# Patient Record
Sex: Female | Born: 2000 | Race: White | Hispanic: No | Marital: Single | Smoking: Never smoker
Health system: Southern US, Community
[De-identification: ages and names within clinical notes are randomized; demographics above are authoritative.]

---

## 2021-12-20 ENCOUNTER — Emergency Department (HOSPITAL_COMMUNITY): Payer: BC Managed Care – PPO | Admitting: Anesthesiology

## 2021-12-20 ENCOUNTER — Encounter (HOSPITAL_BASED_OUTPATIENT_CLINIC_OR_DEPARTMENT_OTHER): Payer: Self-pay

## 2021-12-20 ENCOUNTER — Other Ambulatory Visit: Payer: Self-pay

## 2021-12-20 ENCOUNTER — Emergency Department (HOSPITAL_BASED_OUTPATIENT_CLINIC_OR_DEPARTMENT_OTHER): Payer: BC Managed Care – PPO

## 2021-12-20 ENCOUNTER — Encounter (HOSPITAL_COMMUNITY)
Admission: EM | Disposition: A | Discharge status: Home / Self Care | Payer: Self-pay | Source: Home / Self Care | Attending: Emergency Medicine

## 2021-12-20 ENCOUNTER — Ambulatory Visit (HOSPITAL_BASED_OUTPATIENT_CLINIC_OR_DEPARTMENT_OTHER)
Admission: EM | Admit: 2021-12-20 | Discharge: 2021-12-20 | Disposition: A | Discharge status: Home / Self Care | Payer: BC Managed Care – PPO | Attending: Emergency Medicine | Admitting: Emergency Medicine

## 2021-12-20 DIAGNOSIS — F17290 Nicotine dependence, other tobacco product, uncomplicated: Secondary | ICD-10-CM | POA: Insufficient documentation

## 2021-12-20 DIAGNOSIS — K3580 Unspecified acute appendicitis: Secondary | ICD-10-CM | POA: Insufficient documentation

## 2021-12-20 HISTORY — PX: LAPAROSCOPIC APPENDECTOMY: SHX408

## 2021-12-20 LAB — URINALYSIS, ROUTINE W REFLEX MICROSCOPIC
Bilirubin Urine: NEGATIVE
Glucose, UA: NEGATIVE mg/dL
Ketones, ur: >=80 mg/dL — AB
Leukocytes,Ua: NEGATIVE
Nitrite: NEGATIVE
Protein, ur: NEGATIVE mg/dL
Specific Gravity, Urine: >=1.03 (ref 1.005–1.030)
pH: 5 (ref 5.0–8.0)

## 2021-12-20 LAB — URINALYSIS, MICROSCOPIC (REFLEX)

## 2021-12-20 LAB — COMPREHENSIVE METABOLIC PANEL
ALT: 16 U/L (ref 0–44)
AST: 20 U/L (ref 15–41)
Albumin: 3.8 g/dL (ref 3.5–5.0)
Alkaline Phosphatase: 48 U/L (ref 38–126)
Anion gap: 8 (ref 5–15)
BUN: 11 mg/dL (ref 6–20)
CO2: 21 mmol/L — ABNORMAL LOW (ref 22–32)
Calcium: 8.8 mg/dL — ABNORMAL LOW (ref 8.9–10.3)
Chloride: 106 mmol/L (ref 98–111)
Creatinine, Ser: 0.62 mg/dL (ref 0.44–1.00)
GFR, Estimated: >60 mL/min (ref >60)
Glucose, Bld: 86 mg/dL (ref 70–99)
Potassium: 3.3 mmol/L — ABNORMAL LOW (ref 3.5–5.1)
Sodium: 135 mmol/L (ref 135–145)
Total Bilirubin: 0.7 mg/dL (ref 0.3–1.2)
Total Protein: 7.3 g/dL (ref 6.5–8.1)

## 2021-12-20 LAB — CBC
HCT: 38.7 % (ref 36.0–46.0)
Hemoglobin: 12.3 g/dL (ref 12.0–15.0)
MCH: 25.8 pg — ABNORMAL LOW (ref 26.0–34.0)
MCHC: 31.8 g/dL (ref 30.0–36.0)
MCV: 81.3 fL (ref 80.0–100.0)
Platelets: 203 10*3/uL (ref 150–400)
RBC: 4.76 MIL/uL (ref 3.87–5.11)
RDW: 14 % (ref 11.5–15.5)
WBC: 12.8 10*3/uL — ABNORMAL HIGH (ref 4.0–10.5)
nRBC: 0 % (ref 0.0–0.2)

## 2021-12-20 LAB — PREGNANCY, URINE: Preg Test, Ur: NEGATIVE

## 2021-12-20 LAB — LIPASE, BLOOD: Lipase: 23 U/L (ref 11–51)

## 2021-12-20 SURGERY — APPENDECTOMY, LAPAROSCOPIC
Anesthesia: General

## 2021-12-20 SURGERY — APPENDECTOMY, LAPAROSCOPIC
Anesthesia: General | Site: Abdomen

## 2021-12-20 MED ORDER — METRONIDAZOLE 500 MG/100ML IV SOLN
500.0000 mg | Freq: Once | INTRAVENOUS | Status: AC
Start: 2021-12-20 — End: 2021-12-20
  Administered 2021-12-20: 500 mg via INTRAVENOUS
  Filled 2021-12-20: qty 100

## 2021-12-20 MED ORDER — BUPIVACAINE-EPINEPHRINE 0.25% -1:200000 IJ SOLN
INTRAMUSCULAR | Status: DC | PRN
  Administered 2021-12-20: 20 mL

## 2021-12-20 MED ORDER — SCOPOLAMINE 1 MG/3DAYS TD PT72
1.0000 | MEDICATED_PATCH | TRANSDERMAL | Status: DC
  Administered 2021-12-20: 1.5 mg via TRANSDERMAL
  Filled 2021-12-20: qty 1

## 2021-12-20 MED ORDER — LACTATED RINGERS IV SOLN: INTRAVENOUS | Status: DC

## 2021-12-20 MED ORDER — PROPOFOL 10 MG/ML IV BOLUS
INTRAVENOUS | Status: DC | PRN
  Administered 2021-12-20: 120 mg via INTRAVENOUS

## 2021-12-20 MED ORDER — MIDAZOLAM HCL 2 MG/2ML IJ SOLN
INTRAMUSCULAR | Status: AC
  Filled 2021-12-20: qty 2

## 2021-12-20 MED ORDER — SODIUM CHLORIDE 0.9 % IV BOLUS
1000.0000 mL | Freq: Once | INTRAVENOUS | Status: AC
  Administered 2021-12-20: 1000 mL via INTRAVENOUS

## 2021-12-20 MED ORDER — PROPOFOL 10 MG/ML IV BOLUS
INTRAVENOUS | Status: AC
  Filled 2021-12-20: qty 20

## 2021-12-20 MED ORDER — OXYCODONE HCL 5 MG PO TABS: 5.0000 mg | ORAL_TABLET | Freq: Once | ORAL | Status: DC | PRN

## 2021-12-20 MED ORDER — AMISULPRIDE (ANTIEMETIC) 5 MG/2ML IV SOLN: 10.0000 mg | Freq: Once | INTRAVENOUS | Status: DC | PRN

## 2021-12-20 MED ORDER — 0.9 % SODIUM CHLORIDE (POUR BTL) OPTIME
TOPICAL | Status: DC | PRN
  Administered 2021-12-20: 1000 mL

## 2021-12-20 MED ORDER — SODIUM CHLORIDE 0.9 % IV SOLN
2.0000 g | Freq: Once | INTRAVENOUS | Status: AC
  Administered 2021-12-20: 2 g via INTRAVENOUS
  Filled 2021-12-20: qty 20

## 2021-12-20 MED ORDER — OXYCODONE HCL 5 MG/5ML PO SOLN: 5.0000 mg | Freq: Once | ORAL | Status: DC | PRN

## 2021-12-20 MED ORDER — FENTANYL CITRATE (PF) 250 MCG/5ML IJ SOLN
INTRAMUSCULAR | Status: AC
  Filled 2021-12-20: qty 5

## 2021-12-20 MED ORDER — ONDANSETRON HCL 4 MG/2ML IJ SOLN
4.0000 mg | Freq: Once | INTRAMUSCULAR | Status: AC
  Administered 2021-12-20: 4 mg via INTRAVENOUS
  Filled 2021-12-20: qty 2

## 2021-12-20 MED ORDER — ONDANSETRON HCL 4 MG/2ML IJ SOLN
INTRAMUSCULAR | Status: DC | PRN
  Administered 2021-12-20: 4 mg via INTRAVENOUS

## 2021-12-20 MED ORDER — FENTANYL CITRATE PF 50 MCG/ML IJ SOSY
25.0000 ug | PREFILLED_SYRINGE | Freq: Once | INTRAMUSCULAR | Status: AC
  Administered 2021-12-20: 25 ug via INTRAVENOUS
  Filled 2021-12-20: qty 1

## 2021-12-20 MED ORDER — DEXAMETHASONE SODIUM PHOSPHATE 10 MG/ML IJ SOLN
INTRAMUSCULAR | Status: DC | PRN
  Administered 2021-12-20: 10 mg via INTRAVENOUS

## 2021-12-20 MED ORDER — LIDOCAINE 2% (20 MG/ML) 5 ML SYRINGE
INTRAMUSCULAR | Status: DC | PRN
  Administered 2021-12-20: 60 mg via INTRAVENOUS

## 2021-12-20 MED ORDER — ORAL CARE MOUTH RINSE: 15.0000 mL | Freq: Once | OROMUCOSAL | Status: AC

## 2021-12-20 MED ORDER — CHLORHEXIDINE GLUCONATE 0.12 % MT SOLN
15.0000 mL | Freq: Once | OROMUCOSAL | Status: AC
  Administered 2021-12-20: 15 mL via OROMUCOSAL

## 2021-12-20 MED ORDER — ROCURONIUM BROMIDE 10 MG/ML (PF) SYRINGE
PREFILLED_SYRINGE | INTRAVENOUS | Status: DC | PRN
  Administered 2021-12-20: 50 mg via INTRAVENOUS

## 2021-12-20 MED ORDER — TRAMADOL HCL 50 MG PO TABS: 50.0000 mg | ORAL_TABLET | Freq: Four times a day (QID) | ORAL | 0 refills | Status: AC | PRN

## 2021-12-20 MED ORDER — SUGAMMADEX SODIUM 200 MG/2ML IV SOLN
INTRAVENOUS | Status: DC | PRN
  Administered 2021-12-20: 200 mg via INTRAVENOUS

## 2021-12-20 MED ORDER — MORPHINE SULFATE (PF) 4 MG/ML IV SOLN
4.0000 mg | Freq: Once | INTRAVENOUS | Status: AC
  Administered 2021-12-20: 4 mg via INTRAVENOUS
  Filled 2021-12-20: qty 1

## 2021-12-20 MED ORDER — MIDAZOLAM HCL 5 MG/5ML IJ SOLN
INTRAMUSCULAR | Status: DC | PRN
  Administered 2021-12-20: 2 mg via INTRAVENOUS

## 2021-12-20 MED ORDER — LIDOCAINE HCL (PF) 2 % IJ SOLN
INTRAMUSCULAR | Status: AC
Start: 2021-12-20 — End: ?
  Filled 2021-12-20: qty 5

## 2021-12-20 MED ORDER — DEXAMETHASONE SODIUM PHOSPHATE 10 MG/ML IJ SOLN
INTRAMUSCULAR | Status: AC
  Filled 2021-12-20: qty 1

## 2021-12-20 MED ORDER — ONDANSETRON HCL 4 MG/2ML IJ SOLN: 4.0000 mg | Freq: Once | INTRAMUSCULAR | Status: DC | PRN

## 2021-12-20 MED ORDER — BUPIVACAINE-EPINEPHRINE (PF) 0.25% -1:200000 IJ SOLN
INTRAMUSCULAR | Status: AC
Start: 2021-12-20 — End: ?
  Filled 2021-12-20: qty 30

## 2021-12-20 MED ORDER — IOHEXOL 300 MG/ML  SOLN
100.0000 mL | Freq: Once | INTRAMUSCULAR | Status: AC | PRN
Start: 2021-12-20 — End: 2021-12-20
  Administered 2021-12-20: 100 mL via INTRAVENOUS

## 2021-12-20 MED ORDER — DEXMEDETOMIDINE (PRECEDEX) IN NS 20 MCG/5ML (4 MCG/ML) IV SYRINGE
PREFILLED_SYRINGE | INTRAVENOUS | Status: AC
  Filled 2021-12-20: qty 5

## 2021-12-20 MED ORDER — FENTANYL CITRATE (PF) 100 MCG/2ML IJ SOLN
INTRAMUSCULAR | Status: DC | PRN
Start: 2021-12-20 — End: 2021-12-20
  Administered 2021-12-20: 100 ug via INTRAVENOUS
  Administered 2021-12-20: 50 ug via INTRAVENOUS
  Administered 2021-12-20: 100 ug via INTRAVENOUS

## 2021-12-20 MED ORDER — ACETAMINOPHEN 500 MG PO TABS
1000.0000 mg | ORAL_TABLET | Freq: Once | ORAL | Status: AC
  Administered 2021-12-20: 1000 mg via ORAL
  Filled 2021-12-20: qty 2

## 2021-12-20 MED ORDER — FENTANYL CITRATE PF 50 MCG/ML IJ SOSY: 25.0000 ug | PREFILLED_SYRINGE | INTRAMUSCULAR | Status: DC | PRN

## 2021-12-20 MED ORDER — ROCURONIUM BROMIDE 10 MG/ML (PF) SYRINGE
PREFILLED_SYRINGE | INTRAVENOUS | Status: AC
  Filled 2021-12-20: qty 10

## 2021-12-20 MED ORDER — ONDANSETRON HCL 4 MG/2ML IJ SOLN
INTRAMUSCULAR | Status: AC
  Filled 2021-12-20: qty 2

## 2021-12-20 MED ORDER — LACTATED RINGERS IV SOLN
INTRAVENOUS | Status: DC | PRN
  Administered 2021-12-20: 1000 mL

## 2021-12-20 SURGICAL SUPPLY — 45 items
APPLIER CLIP ROT 10 11.4 M/L (STAPLE)
BAG COUNTER SPONGE SURGICOUNT (BAG) IMPLANT
BAG RETRIEVAL 10 (BASKET) ×1
CHLORAPREP W/TINT 26 (MISCELLANEOUS) ×2 IMPLANT
CLIP APPLIE ROT 10 11.4 M/L (STAPLE) IMPLANT
COVER SURGICAL LIGHT HANDLE (MISCELLANEOUS) ×2 IMPLANT
CUTTER FLEX LINEAR 45M (STAPLE) ×1 IMPLANT
DERMABOND ADVANCED (GAUZE/BANDAGES/DRESSINGS) ×1
DERMABOND ADVANCED .7 DNX12 (GAUZE/BANDAGES/DRESSINGS) ×1 IMPLANT
DRAPE LAPAROSCOPIC ABDOMINAL (DRAPES) ×2 IMPLANT
ELECT PENCIL ROCKER SW 15FT (MISCELLANEOUS) IMPLANT
ELECT REM PT RETURN 15FT ADLT (MISCELLANEOUS) ×2 IMPLANT
ENDOLOOP SUT PDS II  0 18 (SUTURE)
ENDOLOOP SUT PDS II 0 18 (SUTURE) IMPLANT
GLOVE SURG ORTHO 8.0 STRL STRW (GLOVE) ×2 IMPLANT
GLOVE SURG SYN 7.5  E (GLOVE) ×1
GLOVE SURG SYN 7.5 E (GLOVE) ×1 IMPLANT
GLOVE SURG SYN 7.5 PF PI (GLOVE) ×1 IMPLANT
GOWN STRL REUS W/ TWL XL LVL3 (GOWN DISPOSABLE) ×2 IMPLANT
GOWN STRL REUS W/TWL XL LVL3 (GOWN DISPOSABLE) ×2
IRRIG SUCT STRYKERFLOW 2 WTIP (MISCELLANEOUS) ×2
IRRIGATION SUCT STRKRFLW 2 WTP (MISCELLANEOUS) ×1 IMPLANT
KIT BASIN OR (CUSTOM PROCEDURE TRAY) ×2 IMPLANT
KIT TURNOVER KIT A (KITS) IMPLANT
RELOAD 45 VASCULAR/THIN (ENDOMECHANICALS) IMPLANT
RELOAD STAPLE 45 2.5 WHT GRN (ENDOMECHANICALS) IMPLANT
RELOAD STAPLE 45 3.5 BLU ETS (ENDOMECHANICALS) IMPLANT
RELOAD STAPLE TA45 3.5 REG BLU (ENDOMECHANICALS) ×2 IMPLANT
SET TUBE SMOKE EVAC HIGH FLOW (TUBING) ×2 IMPLANT
SHEARS HARMONIC ACE PLUS 36CM (ENDOMECHANICALS) ×2 IMPLANT
SPIKE FLUID TRANSFER (MISCELLANEOUS) ×2 IMPLANT
STRIP CLOSURE SKIN 1/2X4 (GAUZE/BANDAGES/DRESSINGS) ×2 IMPLANT
SUT MNCRL AB 4-0 PS2 18 (SUTURE) ×2 IMPLANT
SYS BAG RETRIEVAL 10MM (BASKET) ×1
SYSTEM BAG RETRIEVAL 10MM (BASKET) ×1 IMPLANT
TOWEL OR 17X26 10 PK STRL BLUE (TOWEL DISPOSABLE) ×2 IMPLANT
TOWEL OR NON WOVEN STRL DISP B (DISPOSABLE) ×2 IMPLANT
TRAY FOL W/BAG SLVR 16FR STRL (SET/KITS/TRAYS/PACK) IMPLANT
TRAY FOLEY MTR SLVR 14FR STAT (SET/KITS/TRAYS/PACK) IMPLANT
TRAY FOLEY MTR SLVR 16FR STAT (SET/KITS/TRAYS/PACK) IMPLANT
TRAY FOLEY W/BAG SLVR 16FR LF (SET/KITS/TRAYS/PACK) ×1
TRAY LAPAROSCOPIC (CUSTOM PROCEDURE TRAY) ×2 IMPLANT
TROCAR BALLN 12MMX100 BLUNT (TROCAR) ×2 IMPLANT
TROCAR XCEL NON-BLD 11X100MML (ENDOMECHANICALS) ×2 IMPLANT
TROCAR Z-THREAD OPTICAL 5X100M (TROCAR) ×2 IMPLANT

## 2021-12-20 NOTE — ED Notes (Signed)
Phone Handoff Report provided to Short Stay/ PreOp RN at [facility]

## 2021-12-20 NOTE — ED Notes (Signed)
NPO SINCE 0900HRS TODAY ACTUAL WT 76.9KG

## 2021-12-20 NOTE — H&P (Signed)
Danielle Manning 14-Sep-2000  [id number].    Requesting MD: Dalene Seltzer, MD Chief Complaint/Reason for Consult: acute appendicitis   HPI:  Danielle Manning is a 21 y/o F with no known PMH who presented to [facility] with a cc of abdominal pain. States right lower quadrant abdominal pain woke her up from her sleep. Pain described as severe and non-radiating. Tried OTC gas medication without relief of sxs. Associated with nausea. Denies fever, chills, vomiting, or diarrhea. She initially went to urgent care. UA and pregnancy test were negative and she was advised to go to ED for a CT scan. CT abdomen was concerning for early acute appendicitis and surgery was asked to evaluate.   Pt denies a history of abd surgery. Denies tobacco use. Denies use of blood thinning medications. NKDA. Does have a latex allergy.   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 She is living with her parents for the summer.   ROS: As above Review of Systems  All other systems reviewed and are negative.  No family history on file.  History reviewed. No pertinent past medical history.   Social History:  reports that she has never smoked. She has never used smokeless tobacco. She reports current alcohol use. She reports that she does not use drugs.  Allergies:  Allergies  Allergen Reactions   Latex Rash    No medications prior to admission.     Physical Exam: Blood pressure 120/89, pulse 74, temperature 98 F (36.7 C), temperature source Oral, resp. rate 16, height 5\' 8"  (1.727 m), weight 73.5 kg, last menstrual period 11/21/2021, SpO2 100 %. General: Pleasant female laying on hospital bed, appears stated age, NAD. HEENT: head -normocephalic, atraumatic; Eyes: PERRLA, no conjunctival injection Neck- Trachea is midline, no thyromegaly appreciated.  CV- RRR, normal S1/S2, no M/R/G, no peripheral edema  Pulm- breathing is non-labored on room  air Abd- soft, generalized ttp with increased ttp in RLQ, no peritonitis, no organomegaly, no masses/hernias  GU- deferred  MSK- UE/LE symmetrical, no cyanosis, clubbing, or edema. Neuro- CN II-XII grossly in tact, gait not assessed. Psych- Alert and Oriented x3 with appropriate affect Skin: warm and dry, no rashes or lesions   Results for orders placed or performed during the hospital encounter of 12/20/21 (from the past 48 hour(s))  Lipase, blood     Status: None   Collection Time: 12/20/21 10:37 AM  Result Value Ref Range   Lipase 23 11 - 51 U/L    Comment: Performed at [facility], [address] [place], [place]  Comprehensive metabolic panel     Status: Abnormal   Collection Time: 12/20/21 10:37 AM  Result Value Ref Range   Sodium 135 135 - 145 mmol/L   Potassium 3.3 (L) 3.5 - 5.1 mmol/L   Chloride 106 98 - 111 mmol/L   CO2 21 (L) 22 - 32 mmol/L   Glucose, Bld 86 70 - 99 mg/dL    Comment: Glucose reference range applies only to samples taken after fasting for at least 8 hours.   BUN 11 6 - 20 mg/dL   Creatinine, Ser 02/19/22 0.44 - 1.00 mg/dL   Calcium 8.8 (L) 8.9 - 10.3 mg/dL   Total Protein 7.3 6.5 - 8.1 g/dL   Albumin 3.8 3.5 - 5.0 g/dL   AST 20 15 - 41 U/L   ALT 16 0 - 44 U/L   Alkaline Phosphatase 48  38 - 126 U/L   Total Bilirubin 0.7 0.3 - 1.2 mg/dL   GFR, Estimated >16>60 >10>60 mL/min    Comment: (NOTE) Calculated using the CKD-EPI Creatinine Equation (2021)    Anion gap 8 5 - 15    Comment: Performed at [facility], [address] [place], [place] [address]  CBC     Status: Abnormal   Collection Time: 12/20/21 10:37 AM  Result Value Ref Range   WBC 12.8 (H) 4.0 - 10.5 K/uL   RBC 4.76 3.87 - 5.11 MIL/uL   Hemoglobin 12.3 12.0 - 15.0 g/dL   HCT 40.938.7 81.136.0 - 91.446.0 %   MCV 81.3 80.0 - 100.0 fL   MCH 25.8 (L) 26.0 - 34.0 pg   MCHC 31.8 30.0 - 36.0 g/dL   RDW 78.214.0 95.611.5 - 21.315.5 %   Platelets 203 150 - 400 K/uL   nRBC 0.0 0.0 - 0.2 %     Comment: Performed at [facility] [place], [address] [place], [place] [address]  Urinalysis, Routine w reflex microscopic Urine, Clean Catch     Status: Abnormal   Collection Time: 12/20/21 11:05 AM  Result Value Ref Range   Color, Urine YELLOW YELLOW   APPearance CLEAR CLEAR   Specific Gravity, Urine >=1.030 1.005 - 1.030   pH 5.0 5.0 - 8.0   Glucose, UA NEGATIVE NEGATIVE mg/dL   Hgb urine dipstick TRACE (A) NEGATIVE   Bilirubin Urine NEGATIVE NEGATIVE   Ketones, ur >=80 (A) NEGATIVE mg/dL   Protein, ur NEGATIVE NEGATIVE mg/dL   Nitrite NEGATIVE NEGATIVE   Leukocytes,Ua NEGATIVE NEGATIVE    Comment: Performed at [facility] [place], [address] [place], [place] [address]  Pregnancy, urine     Status: None   Collection Time: 12/20/21 11:05 AM  Result Value Ref Range   Preg Test, Ur NEGATIVE NEGATIVE    Comment:        THE SENSITIVITY OF THIS METHODOLOGY IS >20 mIU/mL. Performed at [facility] [place], [address] [place], [place] [address]   Urinalysis, Microscopic (reflex)     Status: Abnormal   Collection Time: 12/20/21 11:05 AM  Result Value Ref Range   RBC / HPF 0-5 0 - 5 RBC/hpf   WBC, UA 0-5 0 - 5 WBC/hpf   Bacteria, UA MANY (A) NONE SEEN   Squamous Epithelial / LPF 6-10 0 - 5   Mucus PRESENT     Comment: Performed at [facility] [place], [address] [place], [place] [address]   CT ABDOMEN PELVIS W CONTRAST  Result Date: 12/20/2021 CLINICAL DATA:  Right lower quadrant pain which awoke patient earlier this morning. EXAM: CT ABDOMEN AND PELVIS WITH CONTRAST TECHNIQUE: Multidetector CT imaging of the abdomen and pelvis was performed using the standard protocol following bolus administration of intravenous contrast. RADIATION DOSE REDUCTION: This exam was performed according to the departmental dose-optimization program which includes automated exposure control, adjustment of the mA and/or kV according to patient size and/or use of iterative  reconstruction technique. CONTRAST:  100mL OMNIPAQUE IOHEXOL 300 MG/ML  SOLN COMPARISON:  None Available. FINDINGS: Lower chest: Clear lung bases. Normal heart size without pericardial or pleural effusion. Hepatobiliary: Mild focal steatosis in segment 4 B. Normal gallbladder, without biliary ductal dilatation. Pancreas: Normal, without mass or ductal dilatation. Spleen: Normal in size, without focal abnormality. Adrenals/Urinary Tract: Normal adrenal glands. Normal kidneys, without hydronephrosis. Normal urinary bladder. Stomach/Bowel: Normal stomach, without  wall thickening. Normal colon and terminal ileum. The appendix is best evaluated on coronal reformats. Originates on image 56 and extends caudally to terminate on image 51. Measures maximally 9 mm on image 52. The appendiceal tip is mildly inflamed with mucosal hyperenhancement, including on 53/2. Normal small bowel. Vascular/Lymphatic: Normal caliber of the aorta and branch vessels. Mildly prominent ileocolic mesenteric nodes are within normal variation for age. No pelvic sidewall adenopathy. Reproductive: Normal uterus and adnexa. Left perineal/labial cystic lesion of 2.2 x 1.9 cm on 93/2. Other: Trace free pelvic fluid is likely physiologic. No free intraperitoneal air. Musculoskeletal: Mild disc bulge at the lumbosacral junction. IMPRESSION: 1. Mild/early non complicated appendicitis, primarily centered about the tip. 2. Left labial/perineal cystic lesion is most likely a Bartholin's gland cyst. This could be correlated with physical exam. These results were called by telephone at the time of interpretation on 12/20/2021 at 12:44 pm to provider [facility] , who verbally acknowledged these results. Electronically Signed   By: Jeronimo Greaves M.D.   On: 12/20/2021 12:47      Assessment/Plan Acute appendicitis without evidence of perforation This is a 21 y/o F with <24h of acute onset abdominal pain, leukocytosis of 12,800, and CT of the abdomen and pelvis  consistent with uncomplicated acute appendicitis. Recommend laparoscopic appendectomy today. NPO. IVF. Possible discharge from PACU post-operatively pending intraoperative findings. If needed will admit to observation post-operatively.    I reviewed nursing notes, ED provider notes, last 24 h vitals and pain scores, last 48 h intake and output, last 24 h labs and trends, and last 24 h imaging results.  Juliet Rude, [facility] Surgery 12/20/2021, 2:51 PM Please see Amion for pager number during day hours 7:00am-4:30pm or 7:00am -11:30am on weekends

## 2021-12-20 NOTE — ED Triage Notes (Signed)
C/o abdominal pain, worse on RLQ. Seen at [facility], sent here for eval for appendicitis.

## 2021-12-20 NOTE — ED Notes (Signed)
All Jewelry removed, non-slip socks placed on client. Mobile phone given to mother.

## 2021-12-20 NOTE — ED Notes (Signed)
ED PA aware of pt cont c/o pain, orders rec

## 2021-12-20 NOTE — ED Provider Notes (Signed)
[facility] EMERGENCY DEPARTMENT Provider Note   CSN: [id number] Arrival date & time: 12/20/21  1004     History  Chief Complaint  Patient presents with   Abdominal Pain    Danielle Manning is a 21 y.o. female presenting to the ED with a chief complaint of right lower quadrant abdominal pain.  Symptoms began approximately 4-1/2 hours ago when she woke up from her sleep.  Reports associated nausea.  Pain will sometimes radiate throughout her entire abdomen.  She has tried Gas-X and Pepto-Bismol without much improvement in her symptoms.  Denies any changes to bowel movements, urination, vaginal or pelvic complaints.  No prior abdominal surgeries.  Seen at urgent care with negative urinalysis and pregnancy test and was sent to the ER to rule out appendicitis.  Last night was in her usual state of health and asymptomatic.  No chest pain, shortness of breath.   Abdominal Pain Associated symptoms: nausea   Associated symptoms: no chest pain, no chills, no constipation, no cough, no diarrhea, no dysuria, no fever, no hematuria, no shortness of breath, no sore throat and no vomiting       Home Medications Prior to Admission medications   Not on File      Allergies    Latex    Review of Systems   Review of Systems  Constitutional:  Negative for appetite change, chills and fever.  HENT:  Negative for ear pain, rhinorrhea, sneezing and sore throat.   Eyes:  Negative for photophobia and visual disturbance.  Respiratory:  Negative for cough, chest tightness, shortness of breath and wheezing.   Cardiovascular:  Negative for chest pain and palpitations.  Gastrointestinal:  Positive for abdominal pain and nausea. Negative for blood in stool, constipation, diarrhea and vomiting.  Genitourinary:  Negative for dysuria, hematuria and urgency.  Musculoskeletal:  Negative for myalgias.  Skin:  Negative for rash.  Neurological:  Negative for dizziness, weakness and light-headedness.    Physical Exam Updated Vital Signs BP 120/89 (BP Location: Right Arm)   Pulse 74   Temp 98 F (36.7 C) (Oral)   Resp 16   Ht 5\' 8"  (1.727 m)   Wt 73.5 kg   LMP 11/21/2021 (Approximate) Comment: neg upreg in er today.  SpO2 100%   BMI 24.63 kg/m  Physical Exam Vitals and nursing note reviewed.  Constitutional:      General: She is not in acute distress.    Appearance: She is well-developed.  HENT:     Head: Normocephalic and atraumatic.     Nose: Nose normal.  Eyes:     General: No scleral icterus.       Left eye: No discharge.     Conjunctiva/sclera: Conjunctivae normal.  Cardiovascular:     Rate and Rhythm: Normal rate and regular rhythm.     Heart sounds: Normal heart sounds. No murmur heard.   No friction rub. No gallop.  Pulmonary:     Effort: Pulmonary effort is normal. No respiratory distress.     Breath sounds: Normal breath sounds.  Abdominal:     General: Bowel sounds are normal. There is no distension.     Palpations: Abdomen is soft.     Tenderness: There is abdominal tenderness in the right lower quadrant. There is no guarding.  Musculoskeletal:        General: Normal range of motion.     Cervical back: Normal range of motion and neck supple.  Skin:    General: Skin  is warm and dry.     Findings: No rash.  Neurological:     Mental Status: She is alert.     Motor: No abnormal muscle tone.     Coordination: Coordination normal.    ED Results / Procedures / Treatments   Labs (all labs ordered are listed, but only abnormal results are displayed) Labs Reviewed  COMPREHENSIVE METABOLIC PANEL - Abnormal; Notable for the following components:      Result Value   Potassium 3.3 (*)    CO2 21 (*)    Calcium 8.8 (*)    All other components within normal limits  CBC - Abnormal; Notable for the following components:   WBC 12.8 (*)    MCH 25.8 (*)    All other components within normal limits  URINALYSIS, ROUTINE W REFLEX MICROSCOPIC - Abnormal; Notable  for the following components:   Hgb urine dipstick TRACE (*)    Ketones, ur >=80 (*)    All other components within normal limits  URINALYSIS, MICROSCOPIC (REFLEX) - Abnormal; Notable for the following components:   Bacteria, UA MANY (*)    All other components within normal limits  LIPASE, BLOOD  PREGNANCY, URINE    EKG None  Radiology CT ABDOMEN PELVIS W CONTRAST  Result Date: 12/20/2021 CLINICAL DATA:  Right lower quadrant pain which awoke patient earlier this morning. EXAM: CT ABDOMEN AND PELVIS WITH CONTRAST TECHNIQUE: Multidetector CT imaging of the abdomen and pelvis was performed using the standard protocol following bolus administration of intravenous contrast. RADIATION DOSE REDUCTION: This exam was performed according to the departmental dose-optimization program which includes automated exposure control, adjustment of the mA and/or kV according to patient size and/or use of iterative reconstruction technique. CONTRAST:  OMNIPAQUE IOHEXOL 300 MG/ML  SOLN COMPARISON:  None Available. FINDINGS: Lower chest: Clear lung bases. Normal heart size without pericardial or pleural effusion. Hepatobiliary: Mild focal steatosis in segment 4 B. Normal gallbladder, without biliary ductal dilatation. Pancreas: Normal, without mass or ductal dilatation. Spleen: Normal in size, without focal abnormality. Adrenals/Urinary Tract: Normal adrenal glands. Normal kidneys, without hydronephrosis. Normal urinary bladder. Stomach/Bowel: Normal stomach, without wall thickening. Normal colon and terminal ileum. The appendix is best evaluated on coronal reformats. Originates on image 56 and extends caudally to terminate on image 51. Measures maximally 9 mm on image 52. The appendiceal tip is mildly inflamed with mucosal hyperenhancement, including on 53/2. Normal small bowel. Vascular/Lymphatic: Normal caliber of the aorta and branch vessels. Mildly prominent ileocolic mesenteric nodes are within normal  variation for age. No pelvic sidewall adenopathy. Reproductive: Normal uterus and adnexa. Left perineal/labial cystic lesion of 2.2 x 1.9 cm on 93/2. Other: Trace free pelvic fluid is likely physiologic. No free intraperitoneal air. Musculoskeletal: Mild disc bulge at the lumbosacral junction. IMPRESSION: 1. Mild/early non complicated appendicitis, primarily centered about the tip. 2. Left labial/perineal cystic lesion is most likely a Bartholin's gland cyst. This could be correlated with physical exam. These results were called by telephone at the time of interpretation on 12/20/2021 at 12:44 pm to provider [facility] , who verbally acknowledged these results. Electronically Signed   By: Jeronimo Greaves M.D.   On: 12/20/2021 12:47    Procedures Procedures    Medications Ordered in ED Medications  cefTRIAXone (ROCEPHIN) 2 g in sodium chloride 0.9 % 100 mL IVPB (2 g Intravenous New Bag/Given 12/20/21 1401)    And  metroNIDAZOLE (FLAGYL) IVPB 500 mg (0 mg Intravenous Stopped 12/20/21 1353)  sodium chloride 0.9 %  bolus 1,000 mL (0 mLs Intravenous Stopped 12/20/21 1145)  ondansetron (ZOFRAN) injection 4 mg (4 mg Intravenous Given 12/20/21 1040)  morphine (PF) 4 MG/ML injection 4 mg (4 mg Intravenous Given 12/20/21 1041)  fentaNYL (SUBLIMAZE) injection 25 mcg (25 mcg Intravenous Given 12/20/21 1145)  iohexol (OMNIPAQUE) 300 MG/ML solution 100 mL (100 mLs Intravenous Contrast Given 12/20/21 1225)  fentaNYL (SUBLIMAZE) injection 25 mcg (25 mcg Intravenous Given 12/20/21 1339)    ED Course/ Medical Decision Making/ A&P Clinical Course as of 12/20/21 1405  Tue Dec 20, 2021  1051 WBC(!): 12.8 [HK]    Clinical Course User Index [HK] Dietrich Pates, PA-C                           Medical Decision Making Amount and/or Complexity of Data Reviewed Labs: ordered. Decision-making details documented in ED Course. Radiology: ordered.  Risk Prescription drug management.   21 year old female presenting to the ED for right  lower quadrant abdominal pain.  Symptoms began when she woke up today.  Reports associated nausea.  No changes to bowel movements, urination, pelvic or vaginal complaints.  No chest pain or shortness of breath.  On exam there is tenderness palpation of the right lower quadrant without rebound or guarding.  Vital signs within normal limits.  She had a negative pregnancy test at urgent care just prior to arrival and was sent to the ER to rule out appendicitis.  Will obtain labs, imaging and reassess after results and medication for improvement.  Work-up significant for leukocytosis of 12.3 per urinalysis with many bacteria but patient asymptomatic.  Ketonuria noted as well.  Given IV fluids.  Pregnancy test is negative.  CT of abdomen pelvis shows findings consistent with early noncomplicated appendicitis.  She also has concern for possible left labial Bartholin gland cyst.  Patient states that she is not experiencing any pain or swelling in this area.  I have ordered antibiotics for appendicitis and will consult general surgery.  General surgery to admit patient to their service. Patient has been NPO since waking up this morning.   Portions of this note were generated with Scientist, clinical (histocompatibility and immunogenetics). Dictation errors may occur despite best attempts at proofreading.        Final Clinical Impression(s) / ED Diagnoses Final diagnoses:  Acute appendicitis, unspecified acute appendicitis type    Rx / DC Orders ED Discharge Orders     None         Dietrich Pates, PA-C 12/20/21 1405    Alvira Monday, MD 12/20/21 2214

## 2021-12-20 NOTE — Anesthesia Procedure Notes (Signed)
Procedure Name: Intubation Date/Time: 12/20/2021 3:22 PM Performed by: Gerald Leitz, CRNA Pre-anesthesia Checklist: Patient identified, Patient being monitored, Timeout performed, Emergency Drugs available and Suction available Patient Re-evaluated:Patient Re-evaluated prior to induction Oxygen Delivery Method: Circle system utilized Preoxygenation: Pre-oxygenation with 100% oxygen Induction Type: IV induction Ventilation: Mask ventilation without difficulty Laryngoscope Size: Mac and 3 Grade View: Grade I Tube type: Oral Tube size: 7.0 mm Number of attempts: 1 Airway Equipment and Method: Stylet Placement Confirmation: ETT inserted through vocal cords under direct vision, positive ETCO2 and breath sounds checked- equal and bilateral Secured at: 21 cm Tube secured with: Tape Dental Injury: Teeth and Oropharynx as per pre-operative assessment

## 2021-12-20 NOTE — ED Notes (Signed)
Pt instructed not to get up off stretcher without a staff member in room with her, also instructed to remain NPO until further notice

## 2021-12-20 NOTE — Op Note (Signed)
OPERATIVE REPORT - LAPAROSCOPIC APPENDECTOMY  Preop diagnosis:  Acute appendicitis  Postop diagnosis:  same  Procedure:  Laparoscopic appendectomy  Surgeon:  Darnell Level, MD  Anesthesia:  general endotracheal  Estimated blood loss:  minimal  Preparation:  Chlora-prep  Complications:  none  Indications:  Patient is a 21 yo female who presents to [facility] with <24 hour history of abdominal pain localized to the RLQ.  CT confirms acute appendicitis.  Patient now comes to surgery for appendectomy.  Procedure:  Patient was brought to the operating room and placed in a supine position on the operating room table. Following administration of general anesthesia, a time out was held and the patient's name and procedure was confirmed. Patient was then prepped and draped in the usual strict aseptic fashion.  After ascertaining that an adequate level of anesthesia had been achieved, a peri-umbilical incision was made with a #15 blade. Dissection was carried down to the fascia. Fascia was incised in the midline and the peritoneal cavity was entered cautiously. A #0-vicryl pursestring suture was placed in the fascia. An Hassan cannula was introduced under direct vision and secured with the pursestring suture. The abdomen was insufflated with carbon dioxide. The laparoscope was introduced and the abdomen was explored. Operative ports were placed in the right upper quadrant and left lower quadrant.  The cecum was grasped and rotated cephalad and medially.  The appendix had a normal base and extended laterally and superiorly along the right colic gutter.  The middle third and distal third of the appendix were dilated and indurated consistent with acute appendicitis.  There was no sign of perforation.  A window was created with the harmonic scalpel at the base of the appendix.  An Endo GIA stapler was used to transect the base of the appendix at the junction with the cecal wall.  Staple line was intact  with good hemostasis.  Mesoappendix was taken down with the harmonic scalpel.  The appendix was completely resected and placed into an Endo Catch bag and withdrawn through the umbilical port.  It is submitted to pathology for review.  The #0-vicryl pursestring suture was tied securely.  Right lower quadrant was irrigated with warm saline which was evacuated. Good hemostasis was noted. Ports were removed under direct vision. Good hemostasis was noted at the port sites. Pneumoperitoneum was released.  Skin incisions were anesthetized with local anesthetic. Wounds were closed with interrupted 4-0 Monocryl subcuticular sutures. Wounds were washed and dried and Dermabond was applied. The patient was awakened from anesthesia and brought to the recovery room. The patient tolerated the procedure well.  Darnell Level, MD [facility] Surgery Office: [phone]

## 2021-12-20 NOTE — Anesthesia Preprocedure Evaluation (Signed)
Anesthesia Evaluation  Patient identified by MRN, date of birth, ID band Patient awake    Reviewed: Allergy & Precautions, NPO status , Patient's Chart, lab work & pertinent test results  Airway Mallampati: I  TM Distance: >3 FB Neck ROM: Full    Dental no notable dental hx.    Pulmonary Current Smoker (vapes),    Pulmonary exam normal breath sounds clear to auscultation       Cardiovascular negative cardio ROS Normal cardiovascular exam Rhythm:Regular Rate:Normal     Neuro/Psych negative neurological ROS  negative psych ROS   GI/Hepatic Neg liver ROS, Acute appendicitis   Endo/Other  negative endocrine ROS  Renal/GU negative Renal ROS  negative genitourinary   Musculoskeletal negative musculoskeletal ROS (+)   Abdominal   Peds negative pediatric ROS (+)  Hematology negative hematology ROS (+)   Anesthesia Other Findings   Reproductive/Obstetrics negative OB ROS                             Anesthesia Physical Anesthesia Plan  ASA: 2 and emergent  Anesthesia Plan: General   Post-op Pain Management: Tylenol PO (pre-op)*   Induction: Intravenous and Rapid sequence  PONV Risk Score and Plan: Scopolamine patch - Pre-op, Treatment may vary due to age or medical condition, Midazolam, Dexamethasone and Ondansetron  Airway Management Planned: Oral ETT  Additional Equipment: None  Intra-op Plan:   Post-operative Plan: Extubation in OR  Informed Consent: I have reviewed the patients History and Physical, chart, labs and discussed the procedure including the risks, benefits and alternatives for the proposed anesthesia with the patient or authorized representative who has indicated his/her understanding and acceptance.     Dental advisory given  Plan Discussed with: CRNA, Surgeon and Anesthesiologist  Anesthesia Plan Comments:         Anesthesia Quick Evaluation

## 2021-12-20 NOTE — ED Notes (Signed)
[facility] at bedside 

## 2021-12-20 NOTE — Discharge Summary (Signed)
[facility] Discharge Summary   Patient ID: Danielle Manning MRN: [id number] DOB/AGE: 05-May-2001 21 y.o.  Admit date: 12/20/2021 Discharge date: 12/20/2021  Admitting Diagnosis: Acute appendicitis  Discharge Diagnosis Acute appendicitis s/p laparoscopic appendectomy   Consultants None   Imaging: CT ABDOMEN PELVIS W CONTRAST  Result Date: 12/20/2021 CLINICAL DATA:  Right lower quadrant pain which awoke patient earlier this morning. EXAM: CT ABDOMEN AND PELVIS WITH CONTRAST TECHNIQUE: Multidetector CT imaging of the abdomen and pelvis was performed using the standard protocol following bolus administration of intravenous contrast. RADIATION DOSE REDUCTION: This exam was performed according to the departmental dose-optimization program which includes automated exposure control, adjustment of the mA and/or kV according to patient size and/or use of iterative reconstruction technique. CONTRAST:  OMNIPAQUE IOHEXOL 300 MG/ML  SOLN COMPARISON:  None Available. FINDINGS: Lower chest: Clear lung bases. Normal heart size without pericardial or pleural effusion. Hepatobiliary: Mild focal steatosis in segment 4 B. Normal gallbladder, without biliary ductal dilatation. Pancreas: Normal, without mass or ductal dilatation. Spleen: Normal in size, without focal abnormality. Adrenals/Urinary Tract: Normal adrenal glands. Normal kidneys, without hydronephrosis. Normal urinary bladder. Stomach/Bowel: Normal stomach, without wall thickening. Normal colon and terminal ileum. The appendix is best evaluated on coronal reformats. Originates on image 56 and extends caudally to terminate on image 51. Measures maximally 9 mm on image 52. The appendiceal tip is mildly inflamed with mucosal hyperenhancement, including on 53/2. Normal small bowel. Vascular/Lymphatic: Normal caliber of the aorta and branch vessels. Mildly prominent ileocolic mesenteric nodes are within normal variation for age. No pelvic sidewall  adenopathy. Reproductive: Normal uterus and adnexa. Left perineal/labial cystic lesion of 2.2 x 1.9 cm on 93/2. Other: Trace free pelvic fluid is likely physiologic. No free intraperitoneal air. Musculoskeletal: Mild disc bulge at the lumbosacral junction. IMPRESSION: 1. Mild/early non complicated appendicitis, primarily centered about the tip. 2. Left labial/perineal cystic lesion is most likely a Bartholin's gland cyst. This could be correlated with physical exam. These results were called by telephone at the time of interpretation on 12/20/2021 at 12:44 pm to provider [facility] , who verbally acknowledged these results. Electronically Signed   By: Jeronimo Greaves M.D.   On: 12/20/2021 12:47    Procedures Dr. Darnell Level (12/20/21) - Laparoscopic Appendectomy  Hospital Course:  Patient is an otherwise healthy 21 year old female who presented to [facility] with abdominal pain.  Workup showed acute appendcitis.  Patient was admitted and underwent procedure listed above.  Tolerated procedure well and was transferred to the PACU. On POD0, the patient was voiding well, tolerating liquids, ambulating well, pain well controlled, vital signs stable, incisions c/d/i and felt stable for discharge home.  Patient will follow up in our office in 3 weeks and knows to call with questions or concerns.  She will call to confirm appointment date/time.     I or a member of my team have reviewed this patient in the Controlled Substance Database.   Allergies as of 12/20/2021       Reactions   Latex Rash        Medication List     TAKE these medications    ibuprofen 200 MG tablet Commonly known as: ADVIL Take 800 mg by mouth daily as needed for headache or cramping (pain).   methylphenidate 36 MG CR tablet Commonly known as: CONCERTA Take 36 mg by mouth daily as needed (to focus).   traMADol 50 MG tablet Commonly known as: Ultram Take 1 tablet (50 mg  total) by mouth every 6 (six) hours as needed for moderate pain  or severe pain.   Xulane 150-35 MCG/24HR transdermal patch Generic drug: norelgestromin-ethinyl estradiol Place 1 patch onto the skin See admin instructions. Apply one patch every Sunday for 3 weeks, hold for one week and then resume          Follow-up Information     [facility], [facility]. Go on 01/10/2022.   Specialty: General Surgery Why: Follow up scheduled for 11 AM with Puja Maczis, PA-C. Please arrive 30 min prior to appointment time and have ID and insurance card with you. Contact information: [address] [place] [address] [phone]                 Signed: Juliet Rude , [facility] 12/20/2021, 4:20 PM Please see Amion for pager number during day hours 7:00am-4:30pm

## 2021-12-20 NOTE — Transfer of Care (Signed)
Immediate Anesthesia Transfer of Care Note  Patient: Danielle Manning  Procedure(s) Performed: Procedure(s): APPENDECTOMY LAPAROSCOPIC (N/A)  Patient Location: PACU  Anesthesia Type:General  Level of Consciousness: Alert, Awake, Oriented  Airway & Oxygen Therapy: Patient Spontanous Breathing  Post-op Assessment: Report given to RN  Post vital signs: Reviewed and stable  Last Vitals:  Vitals:   12/20/21 1504 12/20/21 1626  BP: 125/80 (!) 133/94  Pulse: 73 (!) 110  Resp: 16 17  Temp: 36.7 C 36.6 C  SpO2: 98% 100%    Complications: No apparent anesthesia complications

## 2021-12-20 NOTE — Discharge Instructions (Signed)
[facility] LAPAROSCOPIC SURGERY: POST OP INSTRUCTIONS Always review your discharge instruction sheet given to you by the facility where your surgery was performed. IF YOU HAVE DISABILITY OR FAMILY LEAVE FORMS, YOU MUST BRING THEM TO THE OFFICE FOR PROCESSING.   DO NOT GIVE THEM TO YOUR DOCTOR.  PAIN CONTROL  First take acetaminophen (Tylenol) AND/or ibuprofen (Advil) to control your pain after surgery.  Follow directions on package.  Taking acetaminophen (Tylenol) and/or ibuprofen (Advil) regularly after surgery will help to control your pain and lower the amount of prescription pain medication you may need.  You should not take more than 3,000 mg (3 grams) of acetaminophen (Tylenol) in 24 hours.  You should not take ibuprofen (Advil), aleve, motrin, naprosyn or other NSAIDS if you have a history of stomach ulcers or chronic kidney disease.  A prescription for pain medication may be given to you upon discharge.  Take your pain medication as prescribed, if you still have uncontrolled pain after taking acetaminophen (Tylenol) or ibuprofen (Advil). Use ice packs to help control pain. If you need a refill on your pain medication, please contact your pharmacy.  They will contact our office to request authorization. Prescriptions will not be filled after 5pm or on week-ends.  HOME MEDICATIONS Take your usually prescribed medications unless otherwise directed.  DIET You should follow a light diet the first few days after arrival home.  Be sure to include lots of fluids daily. Avoid fatty, fried foods.   CONSTIPATION It is common to experience some constipation after surgery and if you are taking pain medication.  Increasing fluid intake and taking a stool softener (such as Colace) will usually help or prevent this problem from occurring.  A mild laxative (Milk of Magnesia or Miralax) should be taken according to package instructions if there are no bowel movements after 48  hours.  WOUND/INCISION CARE Most patients will experience some swelling and bruising in the area of the incisions.  Ice packs will help.  Swelling and bruising can take several days to resolve.  Unless discharge instructions indicate otherwise, follow guidelines below  STERI-STRIPS - you may remove your outer bandages 48 hours after surgery, and you may shower at that time.  You have steri-strips (small skin tapes) in place directly over the incision.  These strips should be left on the skin for 7-10 days.   DERMABOND/SKIN GLUE - you may shower in 24 hours.  The glue will flake off over the next 2-3 weeks. Any sutures or staples will be removed at the office during your follow-up visit.  ACTIVITIES You may resume regular (light) daily activities beginning the next day--such as daily self-care, walking, climbing stairs--gradually increasing activities as tolerated.  You may have sexual intercourse when it is comfortable.  Refrain from any heavy lifting or straining until approved by your doctor. You may drive when you are no longer taking prescription pain medication, you can comfortably wear a seatbelt, and you can safely maneuver your car and apply brakes.  FOLLOW-UP You should see your doctor in the office for a follow-up appointment approximately 2-3 weeks after your surgery.  You should have been given your post-op/follow-up appointment when your surgery was scheduled.  If you did not receive a post-op/follow-up appointment, make sure that you call for this appointment within a day or two after you arrive home to insure a convenient appointment time.   WHEN TO CALL YOUR DOCTOR: Fever over 101.0 Inability to urinate Continued bleeding from incision.   Increased pain, redness, or drainage from the incision. Increasing abdominal pain  The clinic staff is available to answer your questions during regular business hours.  Please don't hesitate to call and ask to speak to one of the nurses for  clinical concerns.  If you have a medical emergency, go to the nearest emergency room or call 911.  A surgeon from [facility] is always on call at the hospital. [address], [address], [place], [place]  [address] ? [address] [address], [place], [place]   [address] [phone] ? [phone] ? FAX [phone] Web site: [email or web address]      Managing Your Pain After Surgery Without Opioids    Thank you for participating in our program to help patients manage their pain after surgery without opioids. This is part of our effort to provide you with the best care possible, without exposing you or your family to the risk that opioids pose.  What pain can I expect after surgery? You can expect to have some pain after surgery. This is normal. The pain is typically worse the day after surgery, and quickly begins to get better. Many studies have found that many patients are able to manage their pain after surgery with Over-the-Counter (OTC) medications such as Tylenol and Motrin. If you have a condition that does not allow you to take Tylenol or Motrin, notify your surgical team.  How will I manage my pain? The best strategy for controlling your pain after surgery is around the clock pain control with Tylenol (acetaminophen) and Motrin (ibuprofen or Advil). Alternating these medications with each other allows you to maximize your pain control. In addition to Tylenol and Motrin, you can use heating pads or ice packs on your incisions to help reduce your pain.  How will I alternate your regular strength over-the-counter pain medication? You will take a dose of pain medication every three hours. Start by taking 650 mg of Tylenol (2 pills of 325 mg) 3 hours later take 600 mg of Motrin (3 pills of 200 mg) 3 hours after taking the Motrin take 650 mg of Tylenol 3 hours after that take 600 mg of Motrin.   - 1 -  See example - if your first dose of Tylenol is at 12:00  PM   12:00 PM Tylenol 650 mg (2 pills of 325 mg)  3:00 PM Motrin 600 mg (3 pills of 200 mg)  6:00 PM Tylenol 650 mg (2 pills of 325 mg)  9:00 PM Motrin 600 mg (3 pills of 200 mg)  Continue alternating every 3 hours   We recommend that you follow this schedule around-the-clock for at least 3 days after surgery, or until you feel that it is no longer needed. Use the table on the last page of this handout to keep track of the medications you are taking. Important: Do not take more than 3000mg of Tylenol or 3200mg of Motrin in a 24-hour period. Do not take ibuprofen/Motrin if you have a history of bleeding stomach ulcers, severe kidney disease, &/or actively taking a blood thinner  What if I still have pain? If you have pain that is not controlled with the over-the-counter pain medications (Tylenol and Motrin or Advil) you might have what we call "breakthrough" pain. You will receive a prescription for a small amount of an opioid pain medication such as Oxycodone, Tramadol, or Tylenol with Codeine. Use these opioid pills in the first 24 hours after surgery if you have breakthrough pain. Do   not take more than 1 pill every 4-6 hours.  If you still have uncontrolled pain after using all opioid pills, don't hesitate to call our staff using the number provided. We will help make sure you are managing your pain in the best way possible, and if necessary, we can provide a prescription for additional pain medication.   Day 1    Time  Name of Medication Number of pills taken  Amount of Acetaminophen  Pain Level   Comments  AM PM       AM PM       AM PM       AM PM       AM PM       AM PM       AM PM       AM PM       Total Daily amount of Acetaminophen Do not take more than  3,000 mg per day      Day 2    Time  Name of Medication Number of pills taken  Amount of Acetaminophen  Pain Level   Comments  AM PM       AM PM       AM PM       AM PM       AM PM       AM PM       AM  PM       AM PM       Total Daily amount of Acetaminophen Do not take more than  3,000 mg per day      Day 3    Time  Name of Medication Number of pills taken  Amount of Acetaminophen  Pain Level   Comments  AM PM       AM PM       AM PM       AM PM          AM PM       AM PM       AM PM       AM PM       Total Daily amount of Acetaminophen Do not take more than  3,000 mg per day      Day 4    Time  Name of Medication Number of pills taken  Amount of Acetaminophen  Pain Level   Comments  AM PM       AM PM       AM PM       AM PM       AM PM       AM PM       AM PM       AM PM       Total Daily amount of Acetaminophen Do not take more than  3,000 mg per day      Day 5    Time  Name of Medication Number of pills taken  Amount of Acetaminophen  Pain Level   Comments  AM PM       AM PM       AM PM       AM PM       AM PM       AM PM       AM PM       AM PM       Total Daily amount of Acetaminophen Do not take more than    3,000 mg per day       Day 6    Time  Name of Medication Number of pills taken  Amount of Acetaminophen  Pain Level  Comments  AM PM       AM PM       AM PM       AM PM       AM PM       AM PM       AM PM       AM PM       Total Daily amount of Acetaminophen Do not take more than  3,000 mg per day      Day 7    Time  Name of Medication Number of pills taken  Amount of Acetaminophen  Pain Level   Comments  AM PM       AM PM       AM PM       AM PM       AM PM       AM PM       AM PM       AM PM       Total Daily amount of Acetaminophen Do not take more than  3,000 mg per day        For additional information about how and where to safely dispose of unused opioid medications - https://www.morepowerfulnc.org  Disclaimer: This document contains information and/or instructional materials adapted from [facility] for the typical patient with your condition. It does not replace medical advice  from your health care provider because your experience may differ from that of the typical patient. Talk to your health care provider if you have any questions about this document, your condition or your treatment plan. Adapted from [facility]  

## 2021-12-20 NOTE — ED Notes (Signed)
Phone Handoff Report given to [employer], spoke with [employer]

## 2021-12-21 NOTE — Anesthesia Postprocedure Evaluation (Signed)
Anesthesia Post Note  Patient: Danielle Manning  Procedure(s) Performed: APPENDECTOMY LAPAROSCOPIC (Abdomen)     Patient location during evaluation: PACU Anesthesia Type: General Level of consciousness: awake Pain management: pain level controlled Vital Signs Assessment: post-procedure vital signs reviewed and stable Respiratory status: spontaneous breathing and respiratory function stable Cardiovascular status: stable Postop Assessment: no apparent nausea or vomiting Anesthetic complications: no   No notable events documented.  Last Vitals:  Vitals:   12/20/21 1700 12/20/21 1713  BP: 124/79 134/78  Pulse: 95 96  Resp: 13 12  Temp: (!) 36.4 C   SpO2: 96% 98%    Last Pain:  Vitals:   12/20/21 1713  TempSrc:   PainSc: 0-No pain   Pain Goal:                   Merlinda Frederick

## 2021-12-22 ENCOUNTER — Encounter (HOSPITAL_COMMUNITY): Payer: Self-pay | Admitting: Surgery

## 2021-12-22 LAB — SURGICAL PATHOLOGY

## 2023-02-26 IMAGING — CT CT ABD-PELV W/ CM
2 of 4 series · 15 of 46 positions shown, 17 images · IV contrast (Omnipaque)
Comparison: None Available.

CLINICAL DATA: Right lower quadrant pain which awoke patient
earlier this morning.

EXAM:
CT ABDOMEN AND PELVIS WITH CONTRAST
TECHNIQUE: Multidetector CT imaging of the abdomen and pelvis was performed
using the standard protocol following bolus administration of
intravenous contrast.

[Series 2: axial st · axial · 0.93mm/px · z∈[-502,-82]mm · 12 of 100 slices shown, 14 images]
[im 8/100  soft-tissue]
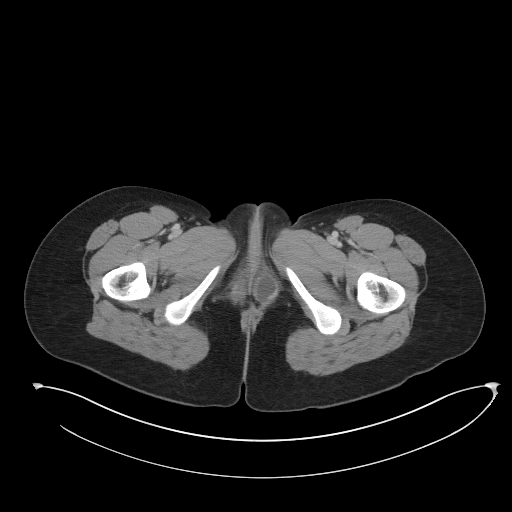
[im 8/100  bone]
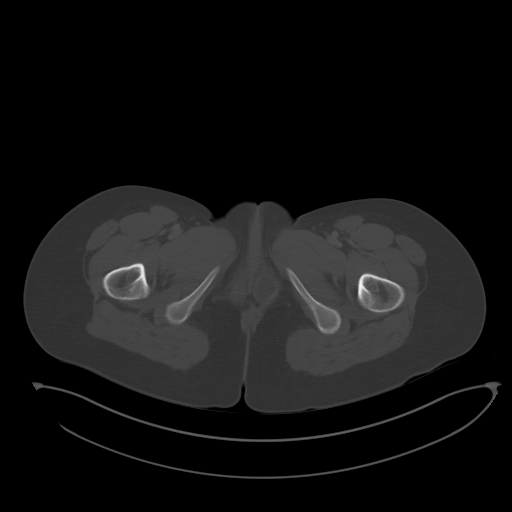
[im 16/100  soft-tissue]
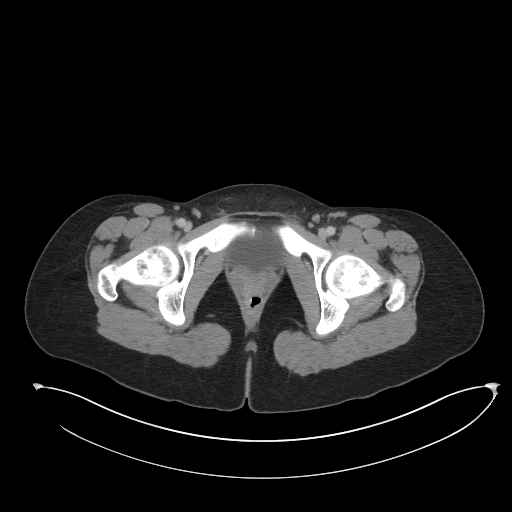
[im 23/100  soft-tissue]
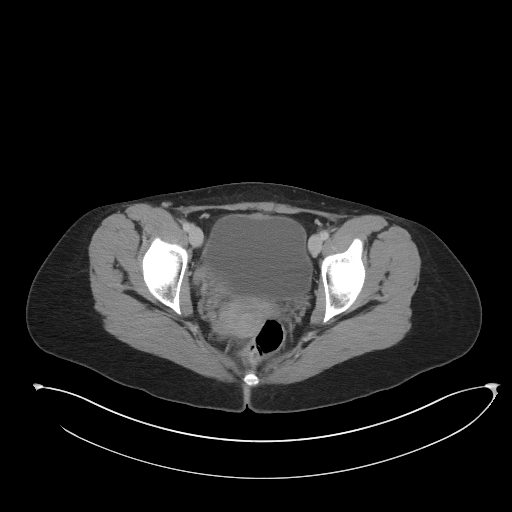
[im 31/100  soft-tissue]
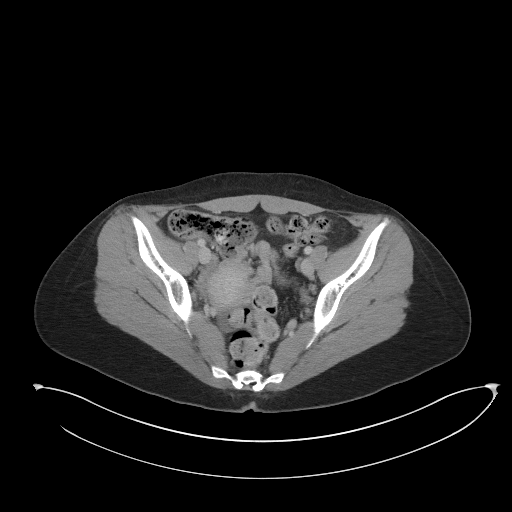
[im 39/100  soft-tissue]
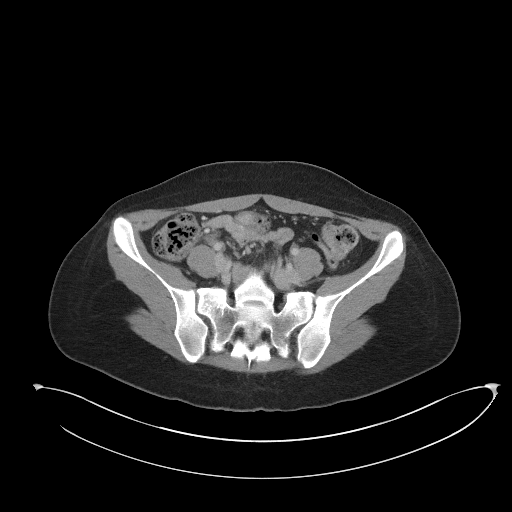
[im 46/100  soft-tissue]
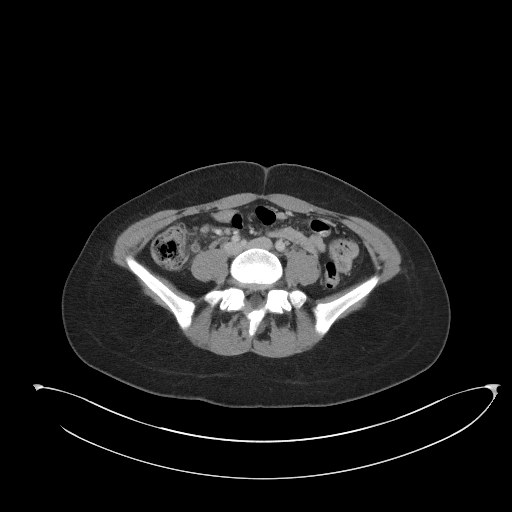
[im 54/100  soft-tissue]
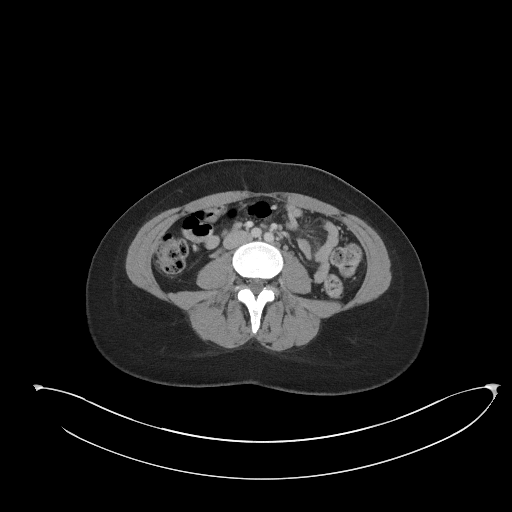
[im 61/100  soft-tissue]
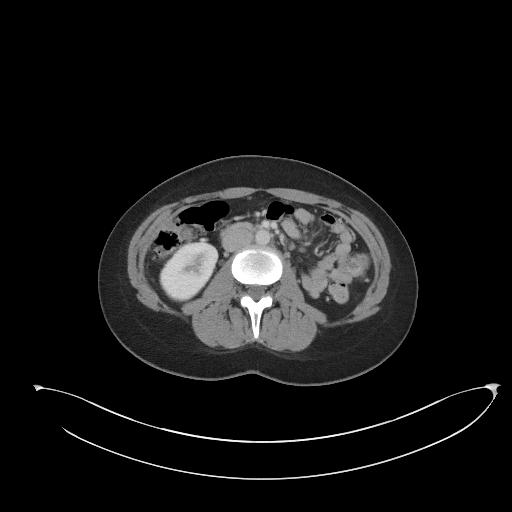
[im 69/100  soft-tissue]
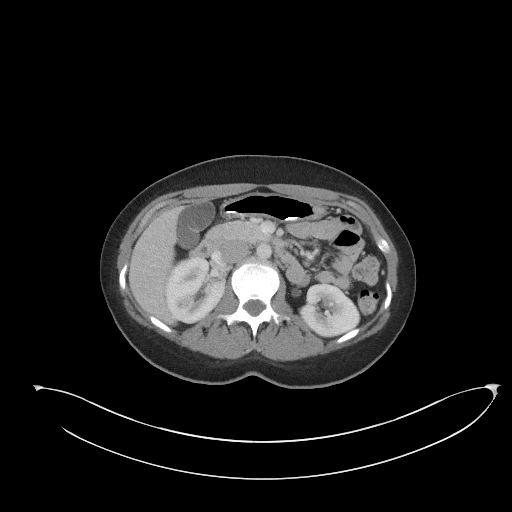
[im 69/100  bone]
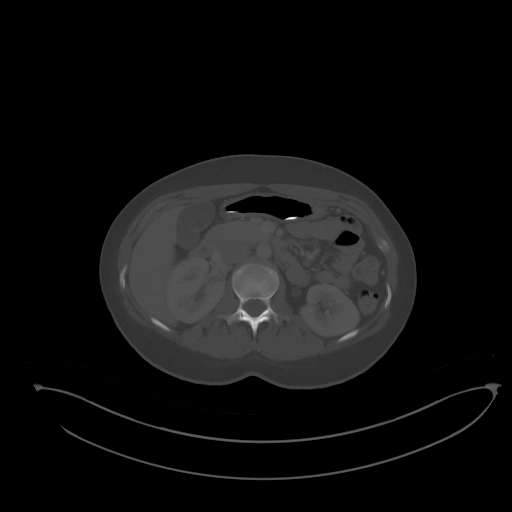
[im 77/100  soft-tissue]
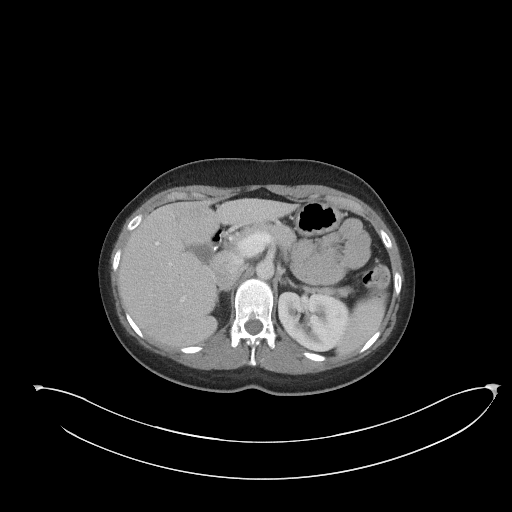
[im 84/100  soft-tissue]
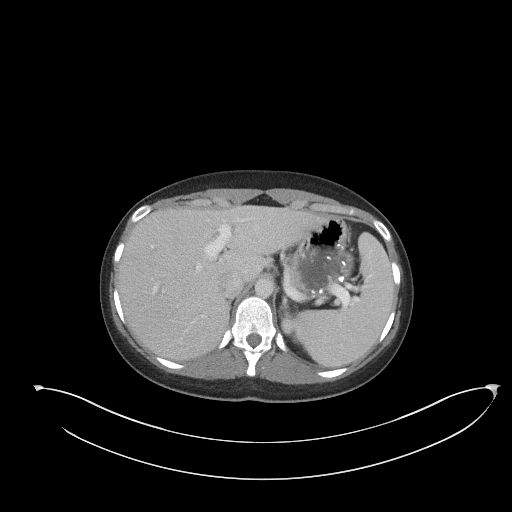
[im 92/100  soft-tissue]
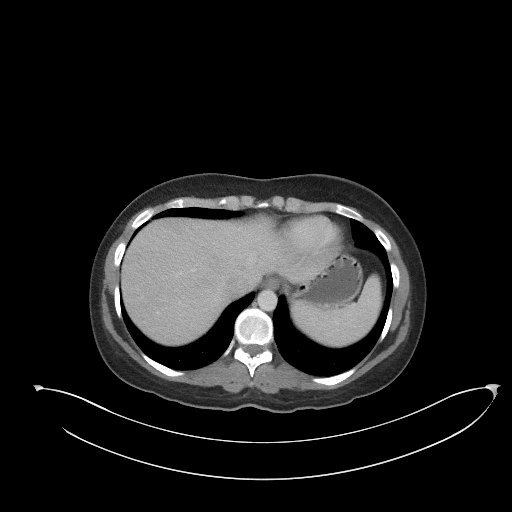

[Series 5: coronal st · coronal · 0.82mm/px · 3 of 81 slices shown]
[im 27/81  soft-tissue]
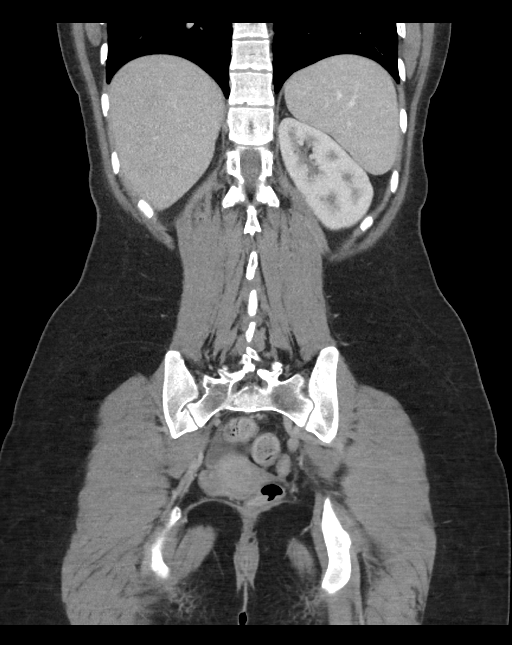
[im 36/81  soft-tissue]
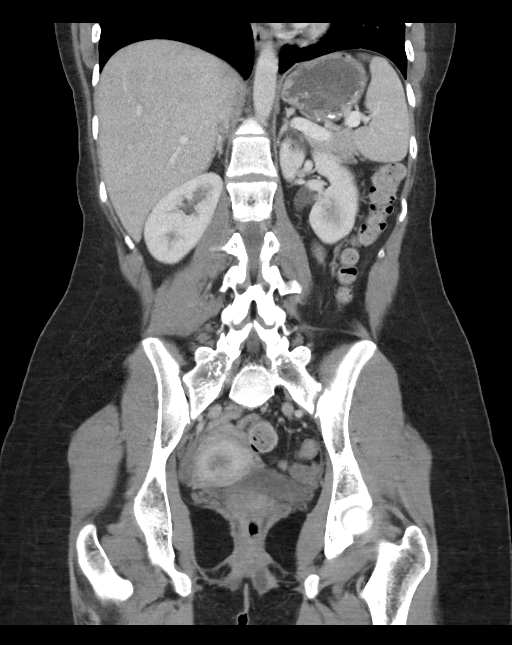
[im 45/81  soft-tissue]
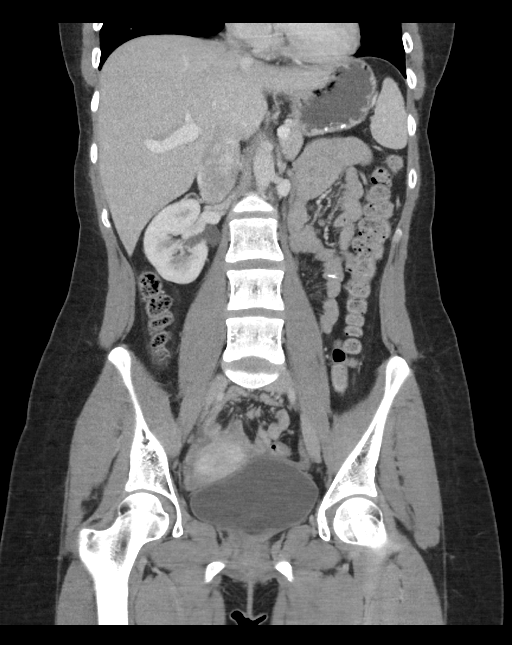

[15 of 46 positions shown; findings below may reference images not displayed]

RADIATION DOSE REDUCTION: This exam was performed according to the
departmental dose-optimization program which includes automated
exposure control, adjustment of the mA and/or kV according to
patient size and/or use of iterative reconstruction technique.

CONTRAST:  100mL OMNIPAQUE IOHEXOL 300 MG/ML  SOLN
FINDINGS: Lower chest: Clear lung bases. Normal heart size without pericardial
or pleural effusion.

Hepatobiliary: Mild focal steatosis in segment 4 B. Normal
gallbladder, without biliary ductal dilatation.

Pancreas: Normal, without mass or ductal dilatation.

Spleen: Normal in size, without focal abnormality.

Adrenals/Urinary Tract: Normal adrenal glands. Normal kidneys,
without hydronephrosis. Normal urinary bladder.

Stomach/Bowel: Normal stomach, without wall thickening. Normal colon
and terminal ileum. The appendix is best evaluated on coronal
reformats. Originates on image 56 and extends caudally to terminate
on image 51. Measures maximally 9 mm on image 52. The appendiceal
tip is mildly inflamed with mucosal hyperenhancement, including on
53/2. Normal small bowel.

Vascular/Lymphatic: Normal caliber of the aorta and branch vessels.
Mildly prominent ileocolic mesenteric nodes are within normal
variation for age. No pelvic sidewall adenopathy.

Reproductive: Normal uterus and adnexa. Left perineal/labial cystic
lesion of 2.2 x 1.9 cm on 93/2.

Other: Trace free pelvic fluid is likely physiologic. No free
intraperitoneal air.

Musculoskeletal: Mild disc bulge at the lumbosacral junction.
IMPRESSION: 1. Mild/early non complicated appendicitis, primarily centered about
the tip.
2. Left labial/perineal cystic lesion is most likely a Bartholin's
gland cyst. This could be correlated with physical exam.

These results were called by telephone at the time of interpretation
on 12/20/2021 at [DATE] to provider QUIRIJN AMAZIGH , who verbally
acknowledged these results.

## 2023-07-26 ENCOUNTER — Ambulatory Visit
Admission: EM | Admit: 2023-07-26 | Discharge: 2023-07-26 | Disposition: A | Discharge status: Home / Self Care | Payer: 59

## 2023-07-26 DIAGNOSIS — H6992 Unspecified Eustachian tube disorder, left ear: Secondary | ICD-10-CM | POA: Diagnosis not present

## 2023-07-26 MED ORDER — CETIRIZINE HCL 10 MG PO TABS: 10.0000 mg | ORAL_TABLET | Freq: Every day | ORAL | 0 refills | Status: AC

## 2023-07-26 MED ORDER — FLUTICASONE PROPIONATE 50 MCG/ACT NA SUSP: 2.0000 | Freq: Every day | NASAL | 0 refills | Status: AC

## 2023-07-26 NOTE — Discharge Instructions (Signed)
 Your eustachian tubes are inflamed causing ear fullness/symptoms.   Flonase 2 puffs into each nostril daily.  Take oral antihistamine daily (choose one of the following: Claritin, allegra, zyrtec).   Tylenol as needed for pain.  If you develop any new or worsening symptoms or if your symptoms do not start to improve, please return here or follow-up with your primary care provider. If your symptoms are severe, please go to the emergency room.

## 2023-07-26 NOTE — ED Provider Notes (Signed)
 [facility]    CSN: [id number] Arrival date & time: 07/26/23  1329      History   Chief Complaint Chief Complaint  Patient presents with   Otalgia    HPI Danielle Manning is a 23 y.o. female.   Danielle Manning is a 23 y.o. female presenting for chief complaint of left ear pain that started 2 weeks ago. Ear pain comes and goes. No recent drainage from the ears, N/V/D, abdominal pain, dizziness, tinnitus, hearing loss, fevers, chills, or viral URI symptoms. Tylenol  improves pain.    Otalgia   History reviewed. No pertinent past medical history.  There are no active problems to display for this patient.   Past Surgical History:  Procedure Laterality Date   LAPAROSCOPIC APPENDECTOMY N/A 12/20/2021   Procedure: APPENDECTOMY LAPAROSCOPIC;  Surgeon: Eletha Boas, MD;  Location: [facility];  Service: General;  Laterality: N/A;    OB History   No obstetric history on file.      Home Medications    Prior to Admission medications   Medication Sig Start Date End Date Taking? Authorizing Provider  cetirizine  (ZYRTEC  ALLERGY) 10 MG tablet Take 1 tablet (10 mg total) by mouth daily. 07/26/23  Yes Enedelia Dorna CHRISTELLA, FNP  fluticasone  (FLONASE ) 50 MCG/ACT nasal spray Place 2 sprays into both nostrils daily. 07/26/23  Yes Enedelia Dorna CHRISTELLA, FNP  norethindrone-ethinyl estradiol-FE (LOESTRIN FE) 1-20 MG-MCG tablet Take 1 tablet by mouth daily. 12/25/22  Yes [provider]  ibuprofen (ADVIL) 200 MG tablet Take 800 mg by mouth daily as needed for headache or cramping (pain).    [provider]  lisdexamfetamine (VYVANSE) 40 MG capsule Take by mouth. 08/18/23   [provider]  methylphenidate 36 MG PO CR tablet Take 36 mg by mouth daily as needed (to focus). 09/27/21   [provider]  norelgestromin-ethinyl estradiol (XULANE) 150-35 MCG/24HR transdermal patch Place 1 patch onto the skin See admin instructions. Apply one patch every Sunday for 3  weeks, hold for one week and then resume    [provider]  traMADol  (ULTRAM ) 50 MG tablet Take 1 tablet (50 mg total) by mouth every 6 (six) hours as needed for moderate pain or severe pain. 12/20/21   Vicci Burnard SAUNDERS, PA-C    Family History History reviewed. No pertinent family history.  Social History Social History   Tobacco Use   Smoking status: Never   Smokeless tobacco: Never  Vaping Use   Vaping status: Every Day  Substance Use Topics   Alcohol use: Yes    Comment: rarely   Drug use: Never     Allergies   Latex   Review of Systems Review of Systems  HENT:  Positive for ear pain.   Per HPI   Physical Exam Triage Vital Signs ED Triage Vitals  Encounter Vitals Group     BP 07/26/23 1346 108/80     Systolic BP Percentile --      Diastolic BP Percentile --      Pulse Rate 07/26/23 1346 82     Resp 07/26/23 1346 18     Temp 07/26/23 1346 98.1 F (36.7 C)     Temp Source 07/26/23 1346 Oral     SpO2 07/26/23 1346 97 %     Weight --      Height 07/26/23 1343 5' 8 (1.727 m)     Head Circumference --      Peak Flow --  Pain Score 07/26/23 1342 1     Pain Loc --      Pain Education --      Exclude from Growth Chart --    No data found.  Updated Vital Signs BP 108/80 (BP Location: Right Arm)   Pulse 82   Temp 98.1 F (36.7 C) (Oral)   Resp 18   Ht 5' 8 (1.727 m)   LMP 07/01/2023 (Exact Date)   SpO2 97%   BMI 24.63 kg/m   Visual Acuity Right Eye Distance:   Left Eye Distance:   Bilateral Distance:    Right Eye Near:   Left Eye Near:    Bilateral Near:     Physical Exam Vitals and nursing note reviewed.  Constitutional:      Appearance: She is not ill-appearing or toxic-appearing.  HENT:     Head: Normocephalic and atraumatic.     Right Ear: Hearing, tympanic membrane, ear canal and external ear normal.     Left Ear: Hearing, tympanic membrane, ear canal and external ear normal.     Ears:     Comments: Slight bulging of  left TM, no erythema.     Nose: Nose normal.     Mouth/Throat:     Lips: Pink.  Eyes:     General: Lids are normal. Vision grossly intact. Gaze aligned appropriately.     Extraocular Movements: Extraocular movements intact.     Conjunctiva/sclera: Conjunctivae normal.  Pulmonary:     Effort: Pulmonary effort is normal.  Musculoskeletal:     Cervical back: Neck supple.  Skin:    General: Skin is warm and dry.     Capillary Refill: Capillary refill takes less than 2 seconds.     Findings: No rash.  Neurological:     General: No focal deficit present.     Mental Status: She is alert and oriented to person, place, and time. Mental status is at baseline.     Cranial Nerves: No dysarthria or facial asymmetry.  Psychiatric:        Mood and Affect: Mood normal.        Speech: Speech normal.        Behavior: Behavior normal.        Thought Content: Thought content normal.        Judgment: Judgment normal.      UC Treatments / Results  Labs (all labs ordered are listed, but only abnormal results are displayed) Labs Reviewed - No data to display  EKG   Radiology No results found.  Procedures Procedures (including critical care time)  Medications Ordered in UC Medications - No data to display  Initial Impression / Assessment and Plan / UC Course  I have reviewed the triage vital signs and the nursing notes.  Pertinent labs & imaging results that were available during my care of the patient were reviewed by me and considered in my medical decision making (see chart for details).   1. Eustachian tube dysfunction, left Presentation consistent with eustachian tube dysfunction.  No signs of otitis media, TMJ, injury.  Flonase  and oral antihistamine recommended. Tylenol  as needed for pain.   Counseled patient on potential for adverse effects with medications prescribed/recommended today, strict ER and return-to-clinic precautions discussed, patient verbalized understanding.     Final Clinical Impressions(s) / UC Diagnoses   Final diagnoses:  Dysfunction of left eustachian tube     Discharge Instructions      Your eustachian tubes are inflamed causing ear fullness/symptoms.  Flonase  2 puffs into each nostril daily.  Take oral antihistamine daily (choose one of the following: Claritin, allegra, zyrtec ).   Tylenol  as needed for pain.  If you develop any new or worsening symptoms or if your symptoms do not start to improve, please return here or follow-up with your primary care provider. If your symptoms are severe, please go to the emergency room.    ED Prescriptions     Medication Sig Dispense Auth. Provider   fluticasone  (FLONASE ) 50 MCG/ACT nasal spray Place 2 sprays into both nostrils daily. 16 g Enedelia Going M, FNP   cetirizine  (ZYRTEC  ALLERGY) 10 MG tablet Take 1 tablet (10 mg total) by mouth daily. 30 tablet Enedelia Going HERO, FNP      PDMP not reviewed this encounter.   Enedelia Going HERO, [place] 07/26/23 1428

## 2023-07-26 NOTE — ED Triage Notes (Signed)
 Pt presents with left ear pain x 2 weeks. Pt describes the pain as coming and going, currently rates as a 1/10. OTC ear drops applied with no improvement.
